# Patient Record
Sex: Male | Born: 1990
Health system: Southern US, Community
[De-identification: ages and names within clinical notes are randomized; demographics above are authoritative.]

## PROBLEM LIST (undated history)

## (undated) DIAGNOSIS — J45909 Unspecified asthma, uncomplicated: Secondary | ICD-10-CM

## (undated) HISTORY — DX: Unspecified asthma, uncomplicated: J45.909

---

## 2015-12-28 ENCOUNTER — Ambulatory Visit (INDEPENDENT_AMBULATORY_CARE_PROVIDER_SITE_OTHER): Payer: 59 | Admitting: Emergency Medicine

## 2015-12-28 VITALS — BP 136/84 | HR 65 | Temp 98.2°F | Resp 16 | Ht 68.0 in | Wt 186.0 lb

## 2015-12-28 DIAGNOSIS — J029 Acute pharyngitis, unspecified: Secondary | ICD-10-CM

## 2015-12-28 DIAGNOSIS — J3089 Other allergic rhinitis: Secondary | ICD-10-CM | POA: Diagnosis not present

## 2015-12-28 DIAGNOSIS — J309 Allergic rhinitis, unspecified: Secondary | ICD-10-CM

## 2015-12-28 LAB — POCT RAPID STREP A (OFFICE): RAPID STREP A SCREEN: NEGATIVE

## 2015-12-28 MED ORDER — FLUTICASONE PROPIONATE 50 MCG/ACT NA SUSP: 2.0000 | Freq: Every day | NASAL | Status: AC

## 2015-12-28 NOTE — Progress Notes (Signed)
Patient ID: Ronald Carroll, male   DOB: [birth year], 25 y.o.   MRN: [id number]    By signing my name below, I, Essence Howell, attest that this documentation has been prepared under the direction and in the presence of Collene GobbleSteven A Kahlie Deutscher, MD Electronically Signed: Charline BillsEssence Howell, ED Scribe 12/28/2015 at 2:01 PM.  Chief Complaint:  Chief Complaint  Patient presents with  . Sore Throat    on and off for multiple months  . Nasal Congestion   HPI: Ronald Carroll is a 25 y.o. male [facility] complaining of intermittent sore throat for the past 4 months. Pt reports associated symptoms of itchy throat and nasal congestion at night for the past 4 months as well. He has tried amoxicillin a few months ago without relief. Pt's apartment has carpet but he has tried cleaning it without relief. He denies pet exposure. Pt reports h/o mild asthma in high school but has not had an exacerbation within the past 2-3 years.   Pt works at a plasma [employer].   Past Medical History  Diagnosis Date  . Asthma    History reviewed. No pertinent past surgical history. Social History   Social History  . Marital Status: Single    Spouse Name: N/A  . Number of Children: N/A  . Years of Education: N/A   Social History Main Topics  . Smoking status: Never Smoker   . Smokeless tobacco: None  . Alcohol Use: None  . Drug Use: None  . Sexual Activity: Not Asked   Other Topics Concern  . None   Social History Narrative  . None   Family History  Problem Relation Age of Onset  . Hypertension Mother   . Diabetes Father    No Known Allergies Prior to Admission medications   Not on File   ROS: The patient denies fevers, chills, night sweats, unintentional weight loss, chest pain, palpitations, wheezing, dyspnea on exertion, nausea, vomiting, abdominal pain, dysuria, hematuria, melena, numbness, weakness, or tingling.   All other systems have been reviewed and were otherwise  negative with the exception of those mentioned in the HPI and as above.    PHYSICAL EXAM: Filed Vitals:   12/28/15 1236  BP: 136/84  Pulse: 65  Temp: 98.2 F (36.8 C)  Resp: 16   Body mass index is 28.29 kg/(m^2).  General: Alert, no acute distress HEENT:  Normocephalic, atraumatic, oropharynx patent. Turbinates are prominent with edematous appearance. Eye[facility] Non[facility]OMI[facility] Cardiovascular: Regular rate and rhythm, no rubs murmurs or gallops. No Carotid bruits, radial pulse intact. No pedal edema.  Respiratory: Clear to auscultation bilaterally. No wheezes, rales, or rhonchi. No cyanosis, no use of accessory musculature Abdominal: No organomegaly, abdomen is soft and non-tender, positive bowel sounds. No masses. Musculoskeletal: Gait intact. No edema, tenderness Skin: No rashes. Neurologic: Facial musculature symmetric. Psychiatric: Patient acts appropriately throughout our interaction. Lymphatic: No cervical or submandibular lymphadenopathy  LABS: Results for orders placed or performed in visit on 12/28/15  POCT rapid strep A  Result Value Ref Range   Rapid Strep A Screen Negative Negative   EKG/XRAY:   Primary [facility]  ASSESSMENT/PLAN: Patient will be on Flonase spray Zyrtec at night if that does not control his symptoms will add Singulair.    Gross sideeffects, risk and benefits, and alternatives of medications d/w patient. Patient is aware that all medications have potential sideeffects and we are unable to predict every sideeffect or drug-drug interaction  that may occur.  Lesle Chris MD 12/28/2015 1:19 PM

## 2015-12-28 NOTE — Patient Instructions (Addendum)
Take Zyrtec 10 mg at bedtime. Use Flonase 2 puffs each nares once a day.   Allergic Rhinitis Allergic rhinitis is when the mucous membranes in the nose respond to allergens. Allergens are particles in the air that cause your body to have an allergic reaction. This causes you to release allergic antibodies. Through a chain of events, these eventually cause you to release histamine into the blood stream. Although meant to protect the body, it is this release of histamine that causes your discomfort, such as frequent sneezing, congestion, and an itchy, runny nose.  CAUSES Seasonal allergic rhinitis (hay fever) is caused by pollen allergens that may come from grasses, trees, and weeds. Year-round allergic rhinitis (perennial allergic rhinitis) is caused by allergens such as house dust mites, pet dander, and mold spores. SYMPTOMS  Nasal stuffiness (congestion).  Itchy, runny nose with sneezing and tearing of the eyes. DIAGNOSIS Your health care provider can help you determine the allergen or allergens that trigger your symptoms. If you and your health care provider are unable to determine the allergen, skin or blood testing may be used. Your health care provider will diagnose your condition after taking your health history and performing a physical exam. Your health care provider may assess you for other related conditions, such as asthma, pink eye, or an ear infection. TREATMENT Allergic rhinitis does not have a cure, but it can be controlled by:  Medicines that block allergy symptoms. These may include allergy shots, nasal sprays, and oral antihistamines.  Avoiding the allergen. Hay fever may often be treated with antihistamines in pill or nasal spray forms. Antihistamines block the effects of histamine. There are over-the-counter medicines that may help with nasal congestion and swelling around the eyes. Check with your health care provider before taking or giving this medicine. If avoiding the  allergen or the medicine prescribed do not work, there are many new medicines your health care provider can prescribe. Stronger medicine may be used if initial measures are ineffective. Desensitizing injections can be used if medicine and avoidance does not work. Desensitization is when a patient is given ongoing shots until the body becomes less sensitive to the allergen. Make sure you follow up with your health care provider if problems continue. HOME CARE INSTRUCTIONS It is not possible to completely avoid allergens, but you can reduce your symptoms by taking steps to limit your exposure to them. It helps to know exactly what you are allergic to so that you can avoid your specific triggers. SEEK MEDICAL CARE IF:  You have a fever.  You develop a cough that does not stop easily (persistent).  You have shortness of breath.  You start wheezing.  Symptoms interfere with normal daily activities.   This information is not intended to replace advice given to you by your health care provider. Make sure you discuss any questions you have with your health care provider.   Document Released: 05/10/2001 Document Revised: 09/05/2014 Document Reviewed: 04/22/2013 Elsevier Interactive Patient Education 2016 [employer]  Use Flonase spray 2 puffs each nares each day. Call if symptoms persist and I will add a second medication for you to take.  Allergic Rhinitis Allergic rhinitis is when the mucous membranes in the nose respond to allergens. Allergens are particles in the air that cause your body to have an allergic reaction. This causes you to release allergic antibodies. Through a chain of events, these eventually cause you to release histamine into the blood stream. Although meant to protect the body, it  is this release of histamine that causes your discomfort, such as frequent sneezing, congestion, and an itchy, runny nose.  CAUSES Seasonal allergic rhinitis (hay fever) is caused by pollen  allergens that may come from grasses, trees, and weeds. Year-round allergic rhinitis (perennial allergic rhinitis) is caused by allergens such as house dust mites, pet dander, and mold spores. SYMPTOMS  Nasal stuffiness (congestion).  Itchy, runny nose with sneezing and tearing of the eyes. DIAGNOSIS Your health care provider can help you determine the allergen or allergens that trigger your symptoms. If you and your health care provider are unable to determine the allergen, skin or blood testing may be used. Your health care provider will diagnose your condition after taking your health history and performing a physical exam. Your health care provider may assess you for other related conditions, such as asthma, pink eye, or an ear infection. TREATMENT Allergic rhinitis does not have a cure, but it can be controlled by:  Medicines that block allergy symptoms. These may include allergy shots, nasal sprays, and oral antihistamines.  Avoiding the allergen. Hay fever may often be treated with antihistamines in pill or nasal spray forms. Antihistamines block the effects of histamine. There are over-the-counter medicines that may help with nasal congestion and swelling around the eyes. Check with your health care provider before taking or giving this medicine. If avoiding the allergen or the medicine prescribed do not work, there are many new medicines your health care provider can prescribe. Stronger medicine may be used if initial measures are ineffective. Desensitizing injections can be used if medicine and avoidance does not work. Desensitization is when a patient is given ongoing shots until the body becomes less sensitive to the allergen. Make sure you follow up with your health care provider if problems continue. HOME CARE INSTRUCTIONS It is not possible to completely avoid allergens, but you can reduce your symptoms by taking steps to limit your exposure to them. It helps to know exactly what you  are allergic to so that you can avoid your specific triggers. SEEK MEDICAL CARE IF:  You have a fever.  You develop a cough that does not stop easily (persistent).  You have shortness of breath.  You start wheezing.  Symptoms interfere with normal daily activities.   This information is not intended to replace advice given to you by your health care provider. Make sure you discuss any questions you have with your health care provider.   Document Released: 05/10/2001 Document Revised: 09/05/2014 Document Reviewed: 04/22/2013 Elsevier Interactive Patient Education 2016 [employer].     IF you received an x-ray today, you will receive an invoice from [facility]. Please contact [facility] at [phone] with questions or concerns regarding your invoice.   IF you received labwork today, you will receive an invoice from [employer]. Please contact [facility] at [phone] with questions or concerns regarding your invoice.   Our billing staff will not be able to assist you with questions regarding bills from these companies.  You will be contacted with the lab results as soon as they are available. The fastest way to get your results is to activate your My Chart account. Instructions are located on the last page of this paperwork. If you have not heard from [place] regarding the results in 2 weeks, please contact this office.

## 2016-08-18 ENCOUNTER — Ambulatory Visit (HOSPITAL_COMMUNITY)
Admission: EM | Admit: 2016-08-18 | Discharge: 2016-08-18 | Disposition: A | Discharge status: Home / Self Care | Payer: 59 | Attending: Emergency Medicine | Admitting: Emergency Medicine

## 2016-08-18 ENCOUNTER — Encounter (HOSPITAL_COMMUNITY): Payer: Self-pay | Admitting: Family Medicine

## 2016-08-18 DIAGNOSIS — J0140 Acute pansinusitis, unspecified: Secondary | ICD-10-CM | POA: Diagnosis not present

## 2016-08-18 MED ORDER — PREDNISONE 50 MG PO TABS: ORAL_TABLET | ORAL | 0 refills | Status: DC

## 2016-08-18 MED ORDER — AZITHROMYCIN 250 MG PO TABS: ORAL_TABLET | ORAL | 0 refills | Status: DC

## 2016-08-18 NOTE — Discharge Instructions (Signed)
You have a sinus infection. Take prednisone daily for 5 days. Take azithromycin as prescribed. Use nasal saline spray as often as you can to wash out the sinuses. You should see improvement in the next 2 days. Follow-up as needed.

## 2016-08-18 NOTE — ED Provider Notes (Signed)
[facility]    CSN: [id number] Arrival date & time: 08/18/16  1232     History   Chief Complaint Chief Complaint  Patient presents with  . Cough  . Nasal Congestion    HPI Ronald Carroll is a 25 y.o. male.   HPI  He is a 25 year old man here for evaluation of nasal congestion and cough. His symptoms started about 4 weeks ago. Initially, he had some associated sore throat, but this has resolved. Yesterday, he developed some pain in the right mid back. This is not worse with deep breathing. He describes a stuffy nose that sometimes runs. He also reports some postnasal drainage. Denies any ear pain or drainage. No known fevers. No wheezing. He does report feeling a little short of breath, but attributes this to not being able to breathe through his nose. He has been taking Sudafed and Mucinex without much improvement.  Past Medical History:  Diagnosis Date  . Asthma     Patient Active Problem List   Diagnosis Date Noted  . Allergic rhinitis 12/28/2015    History reviewed. No pertinent surgical history.     Home Medications    Prior to Admission medications   Medication Sig Start Date End Date Taking? Authorizing Provider  azithromycin (ZITHROMAX Z-PAK) 250 MG tablet Take 2 pills today, then 1 pill daily until gone. 08/18/16   Charm RingsErin J Myana Schlup, MD  fluticasone (FLONASE) 50 MCG/ACT nasal spray Place 2 sprays into both nostrils daily. 12/28/15   Collene GobbleSteven A Daub, MD  predniSONE (DELTASONE) 50 MG tablet Take 1 pill daily for 5 days. 08/18/16   Charm RingsErin J Syrah Daughtrey, MD    Family History Family History  Problem Relation Age of Onset  . Hypertension Mother   . Diabetes Father     Social History Social History  Substance Use Topics  . Smoking status: Never Smoker  . Smokeless tobacco: Never Used  . Alcohol use Not on file     Allergies   Patient has no known allergies.   Review of Systems Review of Systems As in history of present illness  Physical  Exam Triage Vital Signs ED Triage Vitals  Enc Vitals Group     BP 08/18/16 1304 135/90     Pulse Rate 08/18/16 1304 84     Resp 08/18/16 1304 18     Temp 08/18/16 1304 98.4 F (36.9 C)     Temp Source 08/18/16 1304 Oral     SpO2 08/18/16 1304 100 %     Weight --      Height --      Head Circumference --      Peak Flow --      Pain Score 08/18/16 1303 5     Pain Loc --      Pain Edu? --      Excl. in GC? --    No data found.   Updated Vital Signs BP 135/90 (BP Location: Left Arm)   Pulse 84   Temp 98.4 F (36.9 C) (Oral)   Resp 18   SpO2 100%   Visual Acuity Right Eye Distance:   Left Eye Distance:   Bilateral Distance:    Right Eye Near:   Left Eye Near:    Bilateral Near:     Physical Exam  Constitutional: He is oriented to person, place, and time. He appears well-developed and well-nourished. No distress.  HENT:  Mouth/Throat: No oropharyngeal exudate.  TMs normal bilaterally. Nasal mucosa is erythematous  and edematous. Minor erythema of the oropharynx.  Neck: Neck supple.  Cardiovascular: Normal rate, regular rhythm and normal heart sounds.   No murmur heard. Pulmonary/Chest: Effort normal and breath sounds normal. No respiratory distress. He has no wheezes. He has no rales.      Lymphadenopathy:    He has cervical adenopathy (shotty).  Neurological: He is alert and oriented to person, place, and time.     UC Treatments / Results  Labs (all labs ordered are listed, but only abnormal results are displayed) Labs Reviewed - No data to display  EKG  EKG Interpretation None       Radiology No results found.  Procedures Procedures (including critical care time)  Medications Ordered in UC Medications - No data to display   Initial Impression / Assessment and Plan / UC Course  I have reviewed the triage vital signs and the nursing notes.  Pertinent labs & imaging results that were available during my care of the patient were reviewed by  me and considered in my medical decision making (see chart for details).  Clinical Course     Treatment for sinusitis with azithromycin and prednisone. Recommended frequent use of nasal saline spray. Follow-up as needed.  Final Clinical Impressions(s) / UC Diagnoses   Final diagnoses:  Acute non-recurrent pansinusitis    New Prescriptions Discharge Medication List as of 08/18/2016  1:27 PM    START taking these medications   Details  azithromycin (ZITHROMAX Z-PAK) 250 MG tablet Take 2 pills today, then 1 pill daily until gone., Normal    predniSONE (DELTASONE) 50 MG tablet Take 1 pill daily for 5 days., Normal         Charm RingsErin J Bartholomew Ramesh, MD 08/18/16 [phone]

## 2016-08-18 NOTE — ED Triage Notes (Signed)
Pt here for cough with congestion x 4 weeks. sts sinus pressure and ear pain. sts he has been taking lots of OTC meds without relief.

## 2017-02-23 ENCOUNTER — Encounter (HOSPITAL_COMMUNITY): Payer: Self-pay | Admitting: *Deleted

## 2017-02-23 ENCOUNTER — Ambulatory Visit (HOSPITAL_COMMUNITY)
Admission: EM | Admit: 2017-02-23 | Discharge: 2017-02-23 | Disposition: A | Discharge status: Home / Self Care | Payer: 59 | Attending: Internal Medicine | Admitting: Internal Medicine

## 2017-02-23 DIAGNOSIS — K594 Anal spasm: Secondary | ICD-10-CM

## 2017-02-23 MED ORDER — HYDROCORTISONE ACETATE 25 MG RE SUPP: 25.0000 mg | Freq: Two times a day (BID) | RECTAL | 0 refills | Status: AC

## 2017-02-23 NOTE — ED Provider Notes (Signed)
CSN: [id number]     Arrival date & time 02/23/17  1001 History   First MD Initiated Contact with Patient 02/23/17 1038     Chief Complaint  Patient presents with  . Rectal Pain   (Consider location/radiation/quality/duration/timing/severity/associated sxs/prior Treatment) 26 year old male states that he was attempting to have a bowel movement this morning and straining and experience severe spasmodic rectal perineal pain. It lasted for several minutes. The pain has extensively diminished although present. He feels like he needs to have a bowel movement but has not had one today. His last normal BM was yesterday. Denies any site of bleeding. Denies trauma.      Past Medical History:  Diagnosis Date  . Asthma    History reviewed. No pertinent surgical history. Family History  Problem Relation Age of Onset  . Hypertension Mother   . Diabetes Father    Social History  Substance Use Topics  . Smoking status: Never Smoker  . Smokeless tobacco: Never Used  . Alcohol use Not on file    Review of Systems  Constitutional: Negative.   HENT: Negative.   Gastrointestinal: Positive for constipation and rectal pain. Negative for anal bleeding, blood in stool, diarrhea, nausea and vomiting.  Genitourinary: Negative.   All other systems reviewed and are negative.   Allergies  Patient has no known allergies.  Home Medications   Prior to Admission medications   Medication Sig Start Date End Date Taking? Authorizing Provider  fluticasone (FLONASE) 50 MCG/ACT nasal spray Place 2 sprays into both nostrils daily. 12/28/15   Collene Gobble, MD  hydrocortisone (ANUSOL-HC) 25 MG suppository Place 1 suppository (25 mg total) rectally 2 (two) times daily. 02/23/17   Hayden Rasmussen, NP   Meds Ordered and Administered this Visit  Medications - No data to display  BP (!) 140/91 (BP Location: Right Arm) Comment: rn notified   Pulse 71   Temp 98.5 F (36.9 C) (Oral)   Resp 16   SpO2 99%  No data  found.   Physical Exam  Constitutional: He is oriented to person, place, and time. He appears well-developed and well-nourished. No distress.  Eyes: EOM are normal.  Neck: Neck supple.  Cardiovascular: Normal rate.   Pulmonary/Chest: Effort normal. No respiratory distress.  Abdominal: Soft. Bowel sounds are normal.  Abdomen percusses tympanic in all areas. No tenderness. No rebound or guarding.    Genitourinary:  Genitourinary Comments: Rectal exam without lesions. No external lesions or discoloration. Digital exam is uncomfortable but no pain. No palpable lesions. No evidence of blood. Small amount of light brown stool.  Musculoskeletal: He exhibits no edema.  Neurological: He is alert and oriented to person, place, and time. He exhibits normal muscle tone.  Skin: Skin is warm and dry.  Psychiatric: He has a normal mood and affect.  Nursing note and vitals reviewed.   Urgent Care Course     Procedures (including critical care time)  Labs Review Labs Reviewed - No data to display  Imaging Review No results found.   Visual Acuity Review  Right Eye Distance:   Left Eye Distance:   Bilateral Distance:    Right Eye Near:   Left Eye Near:    Bilateral Near:         MDM   1. Paroxysmal proctalgia    Oftentimes this condition can be caused by straining. Recommend that for now use a mineral oil fleets enema to ease the initial bowel movement so that you do not strain. Also  if you are not having good bowel movements in the next day or so you may want to try MiraLAX. Increase the fiber in your diet. If this is an ongoing issue with pain he may need to follow-up with a primary care provider or gastroenterologist. He can try the suppositories to see if this may help initially. Meds ordered this encounter  Medications  . hydrocortisone (ANUSOL-HC) 25 MG suppository    Sig: Place 1 suppository (25 mg total) rectally 2 (two) times daily.    Dispense:  12 suppository     Refill:  0    Order Specific Question:   Supervising Provider    Answer:   Eustace MooreMURRAY, LAURA W [[id number]]  Although the description of pain is consistent with proctalgia fugax it is too early to make that diagnosis based on criteria. He is likely having some rectal spasm. We will try the above first and if getting worse may need to see yesterday gastroenterologist.     Hayden RasmussenMabe, Nia Nathaniel, NP 02/23/17 1114

## 2017-02-23 NOTE — Discharge Instructions (Signed)
Oftentimes this condition can be caused by straining. Recommend that for now use a mineral oil fleets enema to ease the initial bowel movement so that you do not strain. Also if you are not having good bowel movements in the next day or so you may want to try MiraLAX. Increase the fiber in your diet. If this is an ongoing issue with pain he may need to follow-up with a primary care provider or gastroenterologist. He can try the suppositories to see if this may help initially.

## 2017-02-23 NOTE — ED Triage Notes (Signed)
Pt   Was  Having  abd  Pain  And   Developed   Pain      In rectal          And  Lower    abd   Area  When he had  A  bm   -  Pt  States  He   Did    Not     Have  A  bm today   He  Did  Have    One   Denies   Any  Rectal  Bleeding     C/o  Pain  In  Rectum

## 2017-12-14 ENCOUNTER — Emergency Department (HOSPITAL_COMMUNITY): Payer: 59

## 2017-12-14 ENCOUNTER — Other Ambulatory Visit: Payer: Self-pay

## 2017-12-14 ENCOUNTER — Emergency Department (HOSPITAL_COMMUNITY)
Admission: EM | Admit: 2017-12-14 | Discharge: 2017-12-14 | Disposition: A | Discharge status: Home / Self Care | Payer: 59 | Attending: Emergency Medicine | Admitting: Emergency Medicine

## 2017-12-14 ENCOUNTER — Encounter (HOSPITAL_COMMUNITY): Payer: Self-pay

## 2017-12-14 DIAGNOSIS — R69 Illness, unspecified: Secondary | ICD-10-CM

## 2017-12-14 DIAGNOSIS — J111 Influenza due to unidentified influenza virus with other respiratory manifestations: Secondary | ICD-10-CM

## 2017-12-14 DIAGNOSIS — J45909 Unspecified asthma, uncomplicated: Secondary | ICD-10-CM | POA: Diagnosis not present

## 2017-12-14 DIAGNOSIS — M25511 Pain in right shoulder: Secondary | ICD-10-CM | POA: Diagnosis present

## 2017-12-14 LAB — URINALYSIS, ROUTINE W REFLEX MICROSCOPIC
BILIRUBIN URINE: NEGATIVE
GLUCOSE, UA: NEGATIVE mg/dL
Hgb urine dipstick: NEGATIVE
KETONES UR: NEGATIVE mg/dL
Leukocytes, UA: NEGATIVE
Nitrite: NEGATIVE
PH: 7 (ref 5.0–8.0)
Protein, ur: NEGATIVE mg/dL
SPECIFIC GRAVITY, URINE: 1.015 (ref 1.005–1.030)

## 2017-12-14 LAB — CBC WITH DIFFERENTIAL/PLATELET
Basophils Absolute: 0 10*3/uL (ref 0.0–0.1)
Basophils Relative: 0 %
EOS PCT: 0 %
Eosinophils Absolute: 0 10*3/uL (ref 0.0–0.7)
HCT: 47.8 % (ref 39.0–52.0)
HEMOGLOBIN: 16.2 g/dL (ref 13.0–17.0)
Lymphocytes Relative: 7 %
Lymphs Abs: 1 10*3/uL (ref 0.7–4.0)
MCH: 28.7 pg (ref 26.0–34.0)
MCHC: 33.9 g/dL (ref 30.0–36.0)
MCV: 84.8 fL (ref 78.0–100.0)
Monocytes Absolute: 0.4 10*3/uL (ref 0.1–1.0)
Monocytes Relative: 3 %
Neutro Abs: 11.7 10*3/uL — ABNORMAL HIGH (ref 1.7–7.7)
Neutrophils Relative %: 90 %
PLATELETS: 256 10*3/uL (ref 150–400)
RBC: 5.64 MIL/uL (ref 4.22–5.81)
RDW: 13.2 % (ref 11.5–15.5)
WBC: 13.2 10*3/uL — AB (ref 4.0–10.5)

## 2017-12-14 LAB — COMPREHENSIVE METABOLIC PANEL
ALK PHOS: 57 U/L (ref 38–126)
ALT: 22 U/L (ref 17–63)
AST: 26 U/L (ref 15–41)
Albumin: 3.8 g/dL (ref 3.5–5.0)
Anion gap: 11 (ref 5–15)
BILIRUBIN TOTAL: 1.1 mg/dL (ref 0.3–1.2)
BUN: 7 mg/dL (ref 6–20)
CALCIUM: 8.8 mg/dL — AB (ref 8.9–10.3)
CO2: 23 mmol/L (ref 22–32)
CREATININE: 0.93 mg/dL (ref 0.61–1.24)
Chloride: 103 mmol/L (ref 101–111)
GFR calc non Af Amer: >60 mL/min (ref >60)
Glucose, Bld: 99 mg/dL (ref 65–99)
Potassium: 4.2 mmol/L (ref 3.5–5.1)
Sodium: 137 mmol/L (ref 135–145)
TOTAL PROTEIN: 6.4 g/dL — AB (ref 6.5–8.1)

## 2017-12-14 LAB — I-STAT TROPONIN, ED: TROPONIN I, POC: 0 ng/mL (ref 0.00–0.08)

## 2017-12-14 LAB — D-DIMER, QUANTITATIVE: D-Dimer, Quant: 0.32 ug/mL-FEU (ref 0.00–0.50)

## 2017-12-14 LAB — I-STAT CG4 LACTIC ACID, ED: Lactic Acid, Venous: 1.43 mmol/L (ref 0.5–1.9)

## 2017-12-14 MED ORDER — OSELTAMIVIR PHOSPHATE 75 MG PO CAPS
75.0000 mg | ORAL_CAPSULE | Freq: Once | ORAL | Status: AC
  Administered 2017-12-14: 75 mg via ORAL
  Filled 2017-12-14: qty 1

## 2017-12-14 MED ORDER — HYDROCODONE-ACETAMINOPHEN 5-325 MG PO TABS: ORAL_TABLET | ORAL | 0 refills | Status: AC

## 2017-12-14 MED ORDER — DIPHENHYDRAMINE HCL 50 MG/ML IJ SOLN
25.0000 mg | Freq: Once | INTRAMUSCULAR | Status: AC
  Administered 2017-12-14: 25 mg via INTRAVENOUS
  Filled 2017-12-14: qty 1

## 2017-12-14 MED ORDER — PROCHLORPERAZINE EDISYLATE 10 MG/2ML IJ SOLN
10.0000 mg | Freq: Once | INTRAMUSCULAR | Status: AC
Start: 2017-12-14 — End: 2017-12-14
  Administered 2017-12-14: 10 mg via INTRAVENOUS
  Filled 2017-12-14: qty 2

## 2017-12-14 MED ORDER — SODIUM CHLORIDE 0.9 % IV BOLUS
1000.0000 mL | Freq: Once | INTRAVENOUS | Status: AC
  Administered 2017-12-14: 1000 mL via INTRAVENOUS

## 2017-12-14 MED ORDER — KETOROLAC TROMETHAMINE 30 MG/ML IJ SOLN
30.0000 mg | Freq: Once | INTRAMUSCULAR | Status: AC
  Administered 2017-12-14: 30 mg via INTRAVENOUS
  Filled 2017-12-14: qty 1

## 2017-12-14 MED ORDER — OSELTAMIVIR PHOSPHATE 75 MG PO CAPS: 75.0000 mg | ORAL_CAPSULE | Freq: Two times a day (BID) | ORAL | 0 refills | Status: AC

## 2017-12-14 NOTE — ED Provider Notes (Signed)
[facility] EMERGENCY DEPARTMENT Provider Note   CSN: [id number] Arrival date & time: 12/14/17  1324     History   Chief Complaint Chief Complaint  Patient presents with  . Shoulder Pain    HPI  Blood pressure 132/83, pulse (!) 101, temperature 99.2 F (37.3 C), temperature source Oral, resp. rate 18, height 5\' 8"  (1.727 m), weight 86.6 kg (191 lb), SpO2 99 %.  Ronald Carroll is a 27 y.o. male with past medical history significant for asthma complaining of right (dominant) shoulder pain intermittently over the course of the last week he developed a left-sided chest pain with headache, myalgia, chills over the last 24 hours.  No family history of early cardiac death, cocaine methamphetamine use, diabetes, hyperlipidemia, hypertension, DVT/PE, calf pain or leg swelling.  Of note, this patient [place] 5 hours to [place] and then drove around the city for 3 hours while he was waiting for his friend to go into the immigration office.  The trip there was 4 days ago and then he drove back 5 hours the next day.  No known sick contacts, he did not receive a flu shot this year.  He denies any cough, fever, chills, rhinorrhea, hemoptysis, sore throat, otalgia.  Also denies any change in vision, dysarthria, ataxia, neck stiffness, confusion.  Friend who is with him states he is mentally at his baseline.  Past Medical History:  Diagnosis Date  . Asthma     Patient Active Problem List   Diagnosis Date Noted  . Allergic rhinitis 12/28/2015    History reviewed. No pertinent surgical history.      Home Medications    Prior to Admission medications   Medication Sig Start Date End Date Taking? Authorizing Provider  fluticasone (FLONASE) 50 MCG/ACT nasal spray Place 2 sprays into both nostrils daily. Patient not taking: Reported on 12/14/2017 12/28/15   Collene Gobble, MD  HYDROcodone-acetaminophen (NORCO/VICODIN) 5-325 MG tablet Take 1-2 tablets by mouth every 6 hours as  needed for pain and/or cough. 12/14/17   Thao Vanover, Joni Reining, PA-C  hydrocortisone (ANUSOL-HC) 25 MG suppository Place 1 suppository (25 mg total) rectally 2 (two) times daily. Patient not taking: Reported on 12/14/2017 02/23/17   Hayden Rasmussen, NP  oseltamivir (TAMIFLU) 75 MG capsule Take 1 capsule (75 mg total) by mouth every 12 (twelve) hours. 12/14/17   Doyal Saric, Joni Reining, PA-C    Family History Family History  Problem Relation Age of Onset  . Hypertension Mother   . Diabetes Father     Social History Social History   Tobacco Use  . Smoking status: Never Smoker  . Smokeless tobacco: Never Used  Substance Use Topics  . Alcohol use: Yes    Alcohol/week: 0.0 oz  . Drug use: Never     Allergies   Patient has no known allergies.   Review of Systems Review of Systems  A complete review of systems was obtained and all systems are negative except as noted in the HPI and PMH.   Physical Exam Updated Vital Signs BP 126/76 (BP Location: Right Arm)   Pulse 96   Temp 98.3 F (36.8 C) (Oral)   Resp 20   Ht 5\' 8"  (1.727 m)   Wt 86.6 kg (191 lb)   SpO2 100%   BMI 29.04 kg/m   Physical Exam  Constitutional: He is oriented to person, place, and time. He appears well-developed and well-nourished. No distress.  HENT:  Head: Normocephalic.  Right Ear: External ear normal.  Left Ear: External ear normal.  Mouth/Throat: Oropharynx is clear and moist. No oropharyngeal exudate.  No drooling or stridor. Posterior pharynx mildly erythematous no significant tonsillar hypertrophy. No exudate. Soft palate rises symmetrically. No TTP or induration under tongue.   No tenderness to palpation of frontal or bilateral maxillary sinuses.  Mild mucosal edema in the nares with scant rhinorrhea.  Bilateral tympanic membranes with normal architecture and good light reflex.    Eyes: Pupils are equal, round, and reactive to light. Conjunctivae and EOM are normal.  Neck: Normal range of motion. Neck  supple. No JVD present. No tracheal deviation present.  Cardiovascular: Normal rate, regular rhythm and intact distal pulses.  Radial pulse equal bilaterally  Pulmonary/Chest: Effort normal and breath sounds normal. No stridor. No respiratory distress. He has no wheezes. He has no rales. He exhibits no tenderness.  Abdominal: Soft. He exhibits no distension and no mass. There is no tenderness. There is no rebound and no guarding.  Musculoskeletal: Normal range of motion. He exhibits no edema or tenderness.  No calf asymmetry, superficial collaterals, palpable cords, edema, Homans sign negative bilaterally.    Neurological: He is alert and oriented to person, place, and time.  Skin: Skin is warm. He is not diaphoretic.  Psychiatric: He has a normal mood and affect.  Nursing note and vitals reviewed.    ED Treatments / Results  Labs (all labs ordered are listed, but only abnormal results are displayed) Labs Reviewed  CBC WITH DIFFERENTIAL/PLATELET - Abnormal; Notable for the following components:      Result Value   WBC 13.2 (*)    Neutro Abs 11.7 (*)    All other components within normal limits  COMPREHENSIVE METABOLIC PANEL - Abnormal; Notable for the following components:   Calcium 8.8 (*)    Total Protein 6.4 (*)    All other components within normal limits  D-DIMER, QUANTITATIVE (NOT AT [facility])  URINALYSIS, ROUTINE W REFLEX MICROSCOPIC  I-STAT TROPONIN, ED  I-STAT CG4 LACTIC ACID, ED  I-STAT CG4 LACTIC ACID, ED    EKG EKG Interpretation  Date/Time:  Thursday December 14 2017 07:44:05 EDT Ventricular Rate:  101 PR Interval:  128 QRS Duration: 80 QT Interval:  306 QTC Calculation: 396 R Axis:   73 Text Interpretation:  Sinus tachycardia T wave abnormality, consider inferior ischemia Abnormal ECG No old tracing to compare Confirmed by Rolan Bucco [phone]) on 12/14/2017 11:11:49 AM   Radiology Dg Chest 2 View  Result Date: 12/14/2017 CLINICAL DATA:  Chest pain EXAM:  CHEST - 2 VIEW COMPARISON:  None. FINDINGS: Heart and mediastinal contours are within normal limits. No focal opacities or effusions. No acute bony abnormality. IMPRESSION: No active cardiopulmonary disease. Electronically Signed   By: Charlett Nose M.D.   On: 12/14/2017 10:09   Dg Shoulder Right  Result Date: 12/14/2017 CLINICAL DATA:  Right shoulder pain. EXAM: RIGHT SHOULDER - 2+ VIEW COMPARISON:  Chest x-ray 12/14/2017. FINDINGS: No acute bony or joint abnormality identified. No evidence of fracture. Mild right-sided pleural thickening noted, possibly secondary to scarring. IMPRESSION: No acute abnormality. Electronically Signed   By: Maisie Fus  Register   On: 12/14/2017 10:38    Procedures Procedures (including critical care time)  Medications Ordered in ED Medications  sodium chloride 0.9 % bolus 1,000 mL (0 mLs Intravenous Stopped 12/14/17 1214)  prochlorperazine (COMPAZINE) injection 10 mg (10 mg Intravenous Given 12/14/17 1011)  diphenhydrAMINE (BENADRYL) injection 25 mg (25 mg Intravenous Given 12/14/17 1011)  ketorolac (TORADOL) 30 MG/ML  injection 30 mg (30 mg Intravenous Given 12/14/17 1011)  oseltamivir (TAMIFLU) capsule 75 mg (75 mg Oral Given 12/14/17 1012)     Initial Impression / Assessment and Plan / ED Course  I have reviewed the triage vital signs and the nursing notes.  Pertinent labs & imaging results that were available during my care of the patient were reviewed by me and considered in my medical decision making (see chart for details).     Vitals:   12/14/17 0748 12/14/17 1035 12/14/17 1111 12/14/17 1211  BP:   124/74 126/76  Pulse:   84 96  Resp:   18 20  Temp:  98.3 F (36.8 C)    TempSrc:  Oral    SpO2:   99% 100%  Weight: 86.6 kg (191 lb)     Height: 5\' 8"  (1.727 m)       Medications  sodium chloride 0.9 % bolus 1,000 mL (0 mLs Intravenous Stopped 12/14/17 1214)  prochlorperazine (COMPAZINE) injection 10 mg (10 mg Intravenous Given 12/14/17 1011)    diphenhydrAMINE (BENADRYL) injection 25 mg (25 mg Intravenous Given 12/14/17 1011)  ketorolac (TORADOL) 30 MG/ML injection 30 mg (30 mg Intravenous Given 12/14/17 1011)  oseltamivir (TAMIFLU) capsule 75 mg (75 mg Oral Given 12/14/17 1012)    Daphene CalamityOscar R Villanueva-Sierra is 27 y.o. male presenting with headache, myalgia, left-sided shoulder pain and chest pain.  The shoulder pain started a week ago the other symptoms within the last several days.  Will this is likely a influenza-like illness, would consider PE in this patient with a recent long car trip and tachycardia with borderline elevated temperature.  Nonfocal neurologic exam with no meningeal signs.  Patient given headache cocktail, will check basic blood work, EKG, d-dimer.  Given his history of asthma we will also start on Tamiflu.  Workup reassuring with negative lactic acid, negative chest x-ray, negative d-dimer, EKG with no acute findings.  Headache improved after headache cocktail and hydration.  Likely flu, will write prescription for Tamiflu and Vicodin and counseled him on infection control techniques.  Evaluation does not show pathology that would require ongoing emergent intervention or inpatient treatment. Pt is hemodynamically stable and mentating appropriately. Discussed findings and plan with patient/guardian, who agrees with care plan. All questions answered. Return precautions discussed and outpatient follow up given.      Final Clinical Impressions(s) / ED Diagnoses   Final diagnoses:  Influenza-like illness    ED Discharge Orders        Ordered    oseltamivir (TAMIFLU) 75 MG capsule  Every 12 hours     12/14/17 1203    HYDROcodone-acetaminophen (NORCO/VICODIN) 5-325 MG tablet     12/14/17 1203       Lanice Folden, Mardella Laymanicole, PA-C 12/14/17 1457    Rolan BuccoBelfi, Melanie, MD 12/14/17 1510

## 2017-12-14 NOTE — ED Triage Notes (Signed)
ON assessment Pt reported this morning he had CP,Nausea and dizziness.

## 2017-12-14 NOTE — Discharge Instructions (Addendum)
Return to the emergency room for any worsening or concerning symptoms including fast breathing, heart racing, confusion, vomiting. ° °Rest, cover your mouth when you cough and wash your hands frequently.  ° °Push fluids: water or Gatorade, do not drink any soda, juice or caffeinated beverages. ° °For fever and pain control you can take Motrin (ibuprofen) as follows: 400 mg (this is normally 2 over the counter pills) every 4 hours with food. ° °Do not return to work or school until a 48 hours after your fever breaks.  ° °Take Vicodin for cough and pain control, do not drink alcohol, drive, care for children or do other critical tasks while taking Vicodin    °

## 2017-12-14 NOTE — ED Triage Notes (Signed)
Pt reports pain in right shoulder that radiates across chest into left shoulder and down arm. PT states the pain began at about 0200. Denies strenuous activity/ injury. Endorses nausea, headache, weakness, and ringing in ears.

## 2018-11-22 IMAGING — CR DG SHOULDER 2+V*R*
3 series · 3 of 3 positions shown · non-contrast
Comparison: Chest x-ray 12/14/2017.

CLINICAL DATA: Right shoulder pain.

EXAM:
RIGHT SHOULDER - 2+ VIEW

[shoulder grashey]
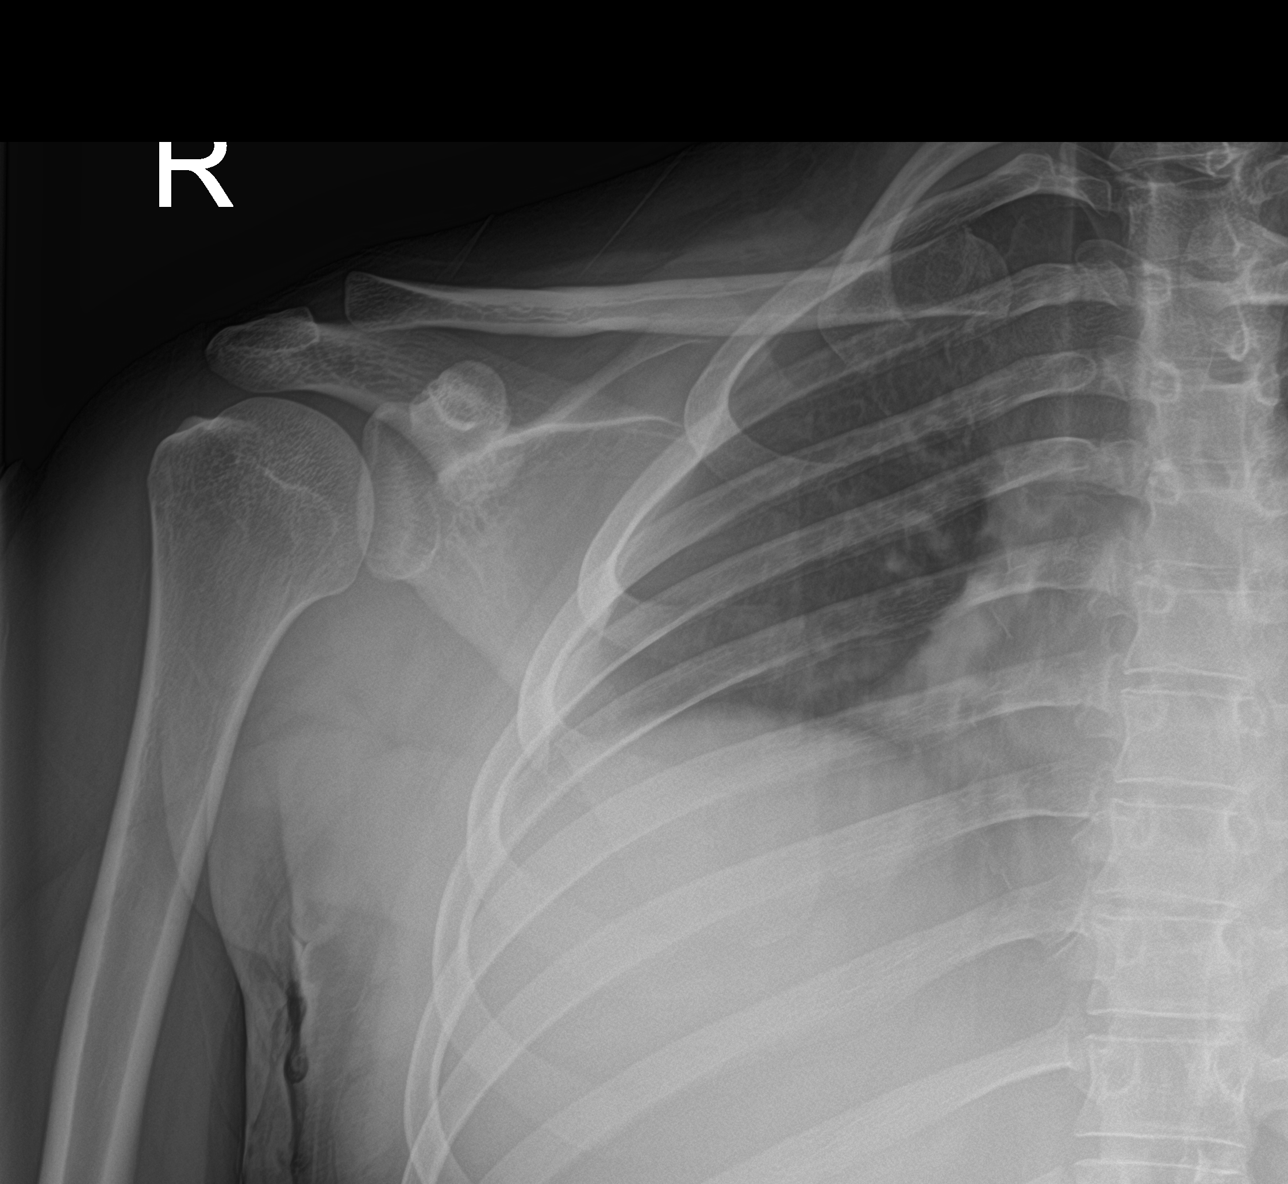

[shoulder axillary]
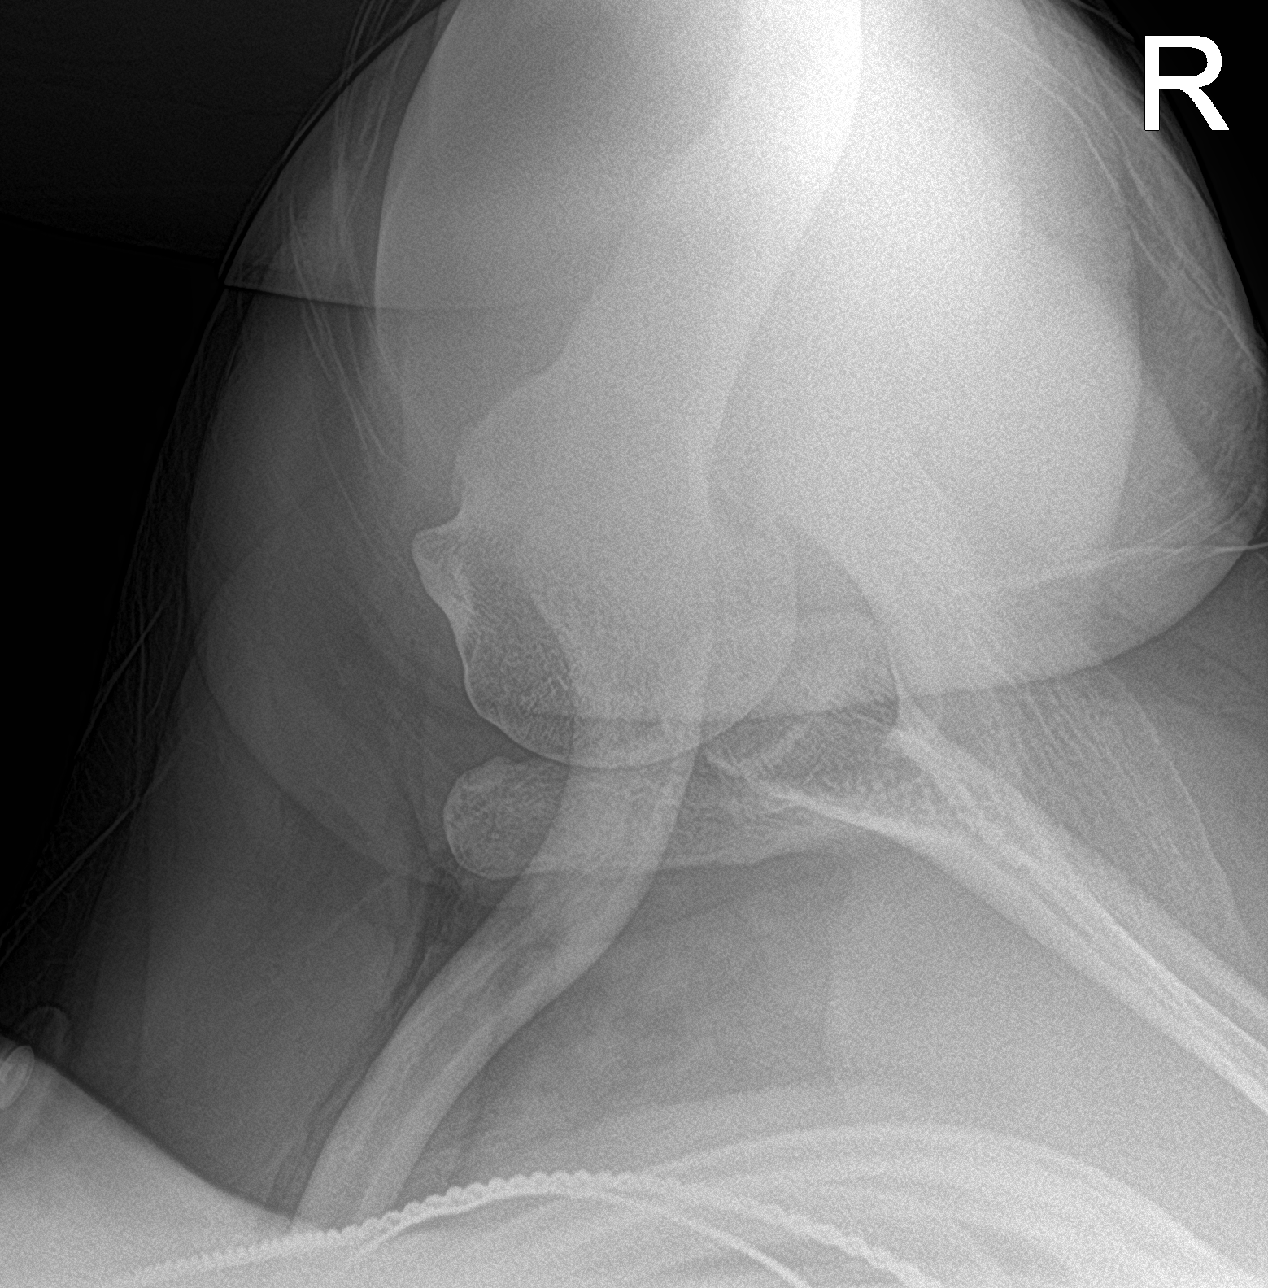

[shoulder y view]
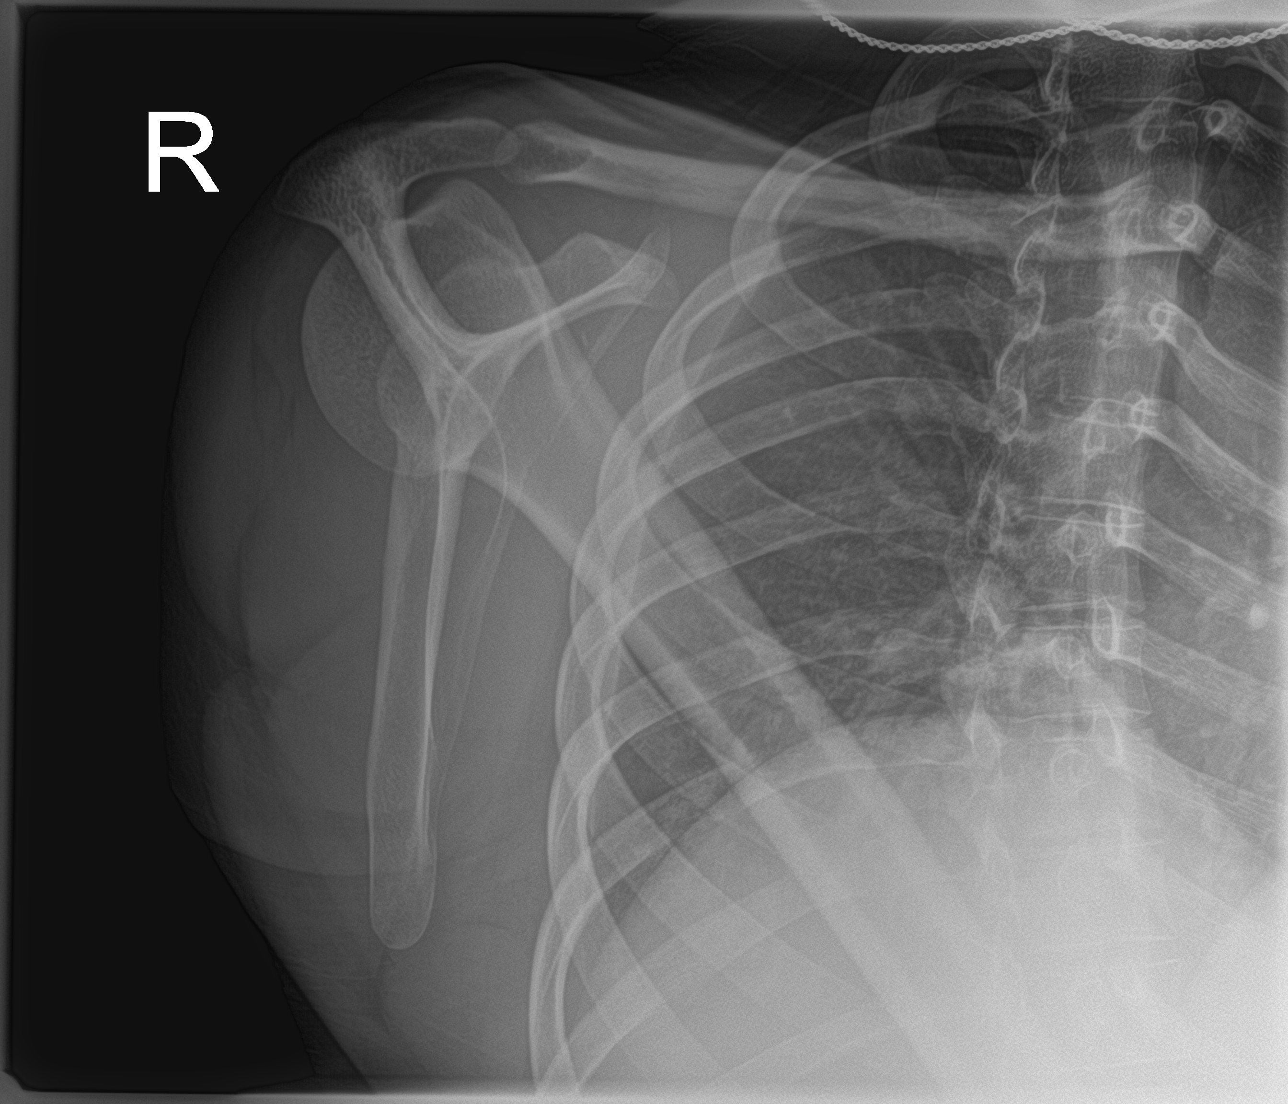

[3 of 3 positions shown; findings below may reference images not displayed]

FINDINGS: No acute bony or joint abnormality identified. No evidence of
fracture. Mild right-sided pleural thickening noted, possibly
secondary to scarring.
IMPRESSION: No acute abnormality.
